# Patient Record
Sex: Male | Born: 1965 | Race: Black or African American | Hispanic: No | Marital: Single | Smoking: Never smoker
Health system: Southern US, Community
[De-identification: ages and names within clinical notes are randomized; demographics above are authoritative.]

---

## 2002-02-04 ENCOUNTER — Encounter: Payer: Self-pay | Admitting: *Deleted

## 2002-02-04 ENCOUNTER — Encounter: Admission: RE | Admit: 2002-02-04 | Discharge: 2002-02-04 | Payer: Self-pay | Admitting: *Deleted

## 2008-06-12 ENCOUNTER — Inpatient Hospital Stay (HOSPITAL_COMMUNITY): Admission: EM | Admit: 2008-06-12 | Discharge: 2008-06-13 | Payer: Self-pay | Admitting: Emergency Medicine
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
## 2013-12-15 ENCOUNTER — Ambulatory Visit: Payer: Self-pay | Admitting: Podiatry

## 2016-08-14 ENCOUNTER — Ambulatory Visit (INDEPENDENT_AMBULATORY_CARE_PROVIDER_SITE_OTHER): Payer: BLUE CROSS/BLUE SHIELD | Admitting: Family Medicine

## 2016-08-14 DIAGNOSIS — M62838 Other muscle spasm: Secondary | ICD-10-CM | POA: Diagnosis not present

## 2016-08-14 MED ORDER — IBUPROFEN 800 MG PO TABS: 800.0000 mg | ORAL_TABLET | Freq: Three times a day (TID) | ORAL | 0 refills | Status: DC | PRN

## 2016-08-14 MED ORDER — CYCLOBENZAPRINE HCL 10 MG PO TABS: 10.0000 mg | ORAL_TABLET | Freq: Three times a day (TID) | ORAL | 0 refills | Status: AC | PRN
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
## 2016-08-14 NOTE — Patient Instructions (Addendum)
   IF you received an x-ray today, you will receive an invoice from [facility]. Please contact [facility] at [phone] with questions or concerns regarding your invoice.   IF you received labwork today, you will receive an invoice from [facility]. Please contact [facility] at [phone] with questions or concerns regarding your invoice.   Our billing staff will not be able to assist you with questions regarding bills from these companies.  You will be contacted with the lab results as soon as they are available. The fastest way to get your results is to activate your My Chart account. Instructions are located on the last page of this paperwork. If you have not heard from us regarding the results in 2 weeks, please contact this office.    Muscle Cramps and Spasms Muscle cramps and spasms occur when a muscle or muscles tighten and you have no control over this tightening (involuntary muscle contraction). They are a common problem and can develop in any muscle. The most common place is in the calf muscles of the leg. Both muscle cramps and muscle spasms are involuntary muscle contractions, but they also have differences:   Muscle cramps are sporadic and painful. They may last a few seconds to a quarter of an hour. Muscle cramps are often more forceful and last longer than muscle spasms.  Muscle spasms may or may not be painful. They may also last just a few seconds or much longer. CAUSES  It is uncommon for cramps or spasms to be due to a serious underlying problem. In many cases, the cause of cramps or spasms is unknown. Some common causes are:   Overexertion.   Overuse from repetitive motions (doing the same thing over and over).   Remaining in a certain position for a long period of time.   Improper preparation, form, or technique while performing a sport or activity.   Dehydration.   Injury.   Side effects of some medicines.    Abnormally low levels of the salts and ions in your blood (electrolytes), especially potassium and calcium. This could happen if you are taking water pills (diuretics) or you are pregnant.  Some underlying medical problems can make it more likely to develop cramps or spasms. These include, but are not limited to:   Diabetes.   Parkinson disease.   Hormone disorders, such as thyroid problems.   Alcohol abuse.   Diseases specific to muscles, joints, and bones.   Blood vessel disease where not enough blood is getting to the muscles.  HOME CARE INSTRUCTIONS   Stay well hydrated. Drink enough water and fluids to keep your urine clear or pale yellow.  It may be helpful to massage, stretch, and relax the affected muscle.  For tight or tense muscles, use a warm towel, heating pad, or hot shower water directed to the affected area.  If you are sore or have pain after a cramp or spasm, applying ice to the affected area may relieve discomfort.  Put ice in a plastic bag.  Place a towel between your skin and the bag.  Leave the ice on for 15-20 minutes, 03-04 times a day.  Medicines used to treat a known cause of cramps or spasms may help reduce their frequency or severity. Only take over-the-counter or prescription medicines as directed by your caregiver. SEEK MEDICAL CARE IF:  Your cramps or spasms get more severe, more frequent, or do not improve over time.  MAKE SURE YOU:     Understand these instructions.  Will watch your condition.  Will get help right away if you are not doing well or get worse.   This information is not intended to replace advice given to you by your health care provider. Make sure you discuss any questions you have with your health care provider.   Document Released: 03/30/2002 Document Revised: 02/02/2013 Document Reviewed: 09/24/2012 Elsevier Interactive Patient Education 2016 Elsevier Inc.  

## 2016-08-17 ENCOUNTER — Ambulatory Visit (INDEPENDENT_AMBULATORY_CARE_PROVIDER_SITE_OTHER): Payer: BLUE CROSS/BLUE SHIELD | Admitting: Podiatry

## 2016-08-17 ENCOUNTER — Encounter: Payer: Self-pay | Admitting: Podiatry

## 2016-08-17 ENCOUNTER — Ambulatory Visit (INDEPENDENT_AMBULATORY_CARE_PROVIDER_SITE_OTHER): Payer: BLUE CROSS/BLUE SHIELD

## 2016-08-17 VITALS — BP 138/84 | HR 71 | Resp 14

## 2016-08-17 DIAGNOSIS — R52 Pain, unspecified: Secondary | ICD-10-CM | POA: Diagnosis not present

## 2016-08-17 DIAGNOSIS — S93601A Unspecified sprain of right foot, initial encounter: Secondary | ICD-10-CM

## 2016-08-17 MED ORDER — HYDROCODONE-ACETAMINOPHEN 5-325 MG PO TABS: 1.0000 | ORAL_TABLET | Freq: Four times a day (QID) | ORAL | 0 refills | Status: AC | PRN
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
## 2016-08-21 ENCOUNTER — Telehealth: Payer: Self-pay | Admitting: *Deleted

## 2016-08-21 DIAGNOSIS — S93601A Unspecified sprain of right foot, initial encounter: Secondary | ICD-10-CM

## 2016-08-21 NOTE — Telephone Encounter (Signed)
Pt states it is hard to get around in the boot. Told pt I would send order to [facility] for the knee scooter, but he should also be resting, icing and elevating the injured foot. Pt states understanding and will wait to hear from [facility].

## 2016-08-28 ENCOUNTER — Ambulatory Visit: Payer: BLUE CROSS/BLUE SHIELD | Admitting: Podiatry

## 2016-08-29 ENCOUNTER — Ambulatory Visit (INDEPENDENT_AMBULATORY_CARE_PROVIDER_SITE_OTHER): Payer: BLUE CROSS/BLUE SHIELD | Admitting: Podiatry

## 2016-08-29 ENCOUNTER — Encounter: Payer: Self-pay | Admitting: Podiatry

## 2016-08-29 VITALS — Ht 74.0 in | Wt 176.0 lb

## 2016-08-29 DIAGNOSIS — S93601D Unspecified sprain of right foot, subsequent encounter: Secondary | ICD-10-CM | POA: Diagnosis not present

## 2016-08-29 DIAGNOSIS — R52 Pain, unspecified: Secondary | ICD-10-CM | POA: Diagnosis not present

## 2016-08-29 DIAGNOSIS — S93601A Unspecified sprain of right foot, initial encounter: Secondary | ICD-10-CM | POA: Diagnosis not present
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
## 2016-09-06 ENCOUNTER — Ambulatory Visit (INDEPENDENT_AMBULATORY_CARE_PROVIDER_SITE_OTHER): Payer: BLUE CROSS/BLUE SHIELD | Admitting: Family Medicine

## 2016-09-06 ENCOUNTER — Ambulatory Visit (INDEPENDENT_AMBULATORY_CARE_PROVIDER_SITE_OTHER): Payer: BLUE CROSS/BLUE SHIELD | Admitting: Podiatry

## 2016-09-06 ENCOUNTER — Ambulatory Visit (INDEPENDENT_AMBULATORY_CARE_PROVIDER_SITE_OTHER): Payer: BLUE CROSS/BLUE SHIELD

## 2016-09-06 ENCOUNTER — Encounter: Payer: Self-pay | Admitting: Podiatry

## 2016-09-06 VITALS — BP 120/80 | HR 83 | Temp 98.3°F | Resp 18 | Ht 74.0 in

## 2016-09-06 VITALS — Ht 74.0 in | Wt 176.0 lb

## 2016-09-06 DIAGNOSIS — M79642 Pain in left hand: Secondary | ICD-10-CM | POA: Diagnosis not present

## 2016-09-06 DIAGNOSIS — S93601D Unspecified sprain of right foot, subsequent encounter: Secondary | ICD-10-CM

## 2016-09-06 MED ORDER — IBUPROFEN 800 MG PO TABS: 800.0000 mg | ORAL_TABLET | Freq: Three times a day (TID) | ORAL | 0 refills | Status: AC | PRN

## 2016-09-06 MED ORDER — IBUPROFEN 800 MG PO TABS: 800.0000 mg | ORAL_TABLET | Freq: Three times a day (TID) | ORAL | 0 refills | Status: DC | PRN
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
## 2016-09-06 NOTE — Patient Instructions (Signed)
     IF you received an x-ray today, you will receive an invoice from [facility]. Please contact [facility] at [phone] with questions or concerns regarding your invoice.   IF you received labwork today, you will receive an invoice from [facility]. Please contact [facility] at [phone] with questions or concerns regarding your invoice.   Our billing staff will not be able to assist you with questions regarding bills from these companies.  You will be contacted with the lab results as soon as they are available. The fastest way to get your results is to activate your My Chart account. Instructions are located on the last page of this paperwork. If you have not heard from us regarding the results in 2 weeks, please contact this office.      

## 2016-09-20 ENCOUNTER — Ambulatory Visit (INDEPENDENT_AMBULATORY_CARE_PROVIDER_SITE_OTHER): Payer: BLUE CROSS/BLUE SHIELD | Admitting: Orthopaedic Surgery

## 2016-09-20 ENCOUNTER — Encounter (INDEPENDENT_AMBULATORY_CARE_PROVIDER_SITE_OTHER): Payer: Self-pay | Admitting: Orthopaedic Surgery

## 2016-09-20 DIAGNOSIS — M79642 Pain in left hand: Secondary | ICD-10-CM

## 2016-09-20 MED ORDER — DICLOFENAC SODIUM 1 % TD GEL: 2.0000 g | Freq: Four times a day (QID) | TRANSDERMAL | 5 refills | Status: DC

## 2016-09-20 MED ORDER — DICLOFENAC SODIUM 1 % TD GEL: 2.0000 g | Freq: Four times a day (QID) | TRANSDERMAL | 5 refills | Status: AC
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
## 2016-09-28 ENCOUNTER — Ambulatory Visit (INDEPENDENT_AMBULATORY_CARE_PROVIDER_SITE_OTHER): Payer: BLUE CROSS/BLUE SHIELD | Admitting: Podiatry

## 2016-09-28 ENCOUNTER — Ambulatory Visit (INDEPENDENT_AMBULATORY_CARE_PROVIDER_SITE_OTHER): Payer: BLUE CROSS/BLUE SHIELD

## 2016-09-28 ENCOUNTER — Encounter: Payer: Self-pay | Admitting: Podiatry

## 2016-09-28 DIAGNOSIS — R52 Pain, unspecified: Secondary | ICD-10-CM

## 2016-09-28 DIAGNOSIS — M79671 Pain in right foot: Secondary | ICD-10-CM | POA: Diagnosis not present

## 2016-09-28 DIAGNOSIS — S93601D Unspecified sprain of right foot, subsequent encounter: Secondary | ICD-10-CM | POA: Diagnosis not present
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
## 2017-02-14 ENCOUNTER — Telehealth (INDEPENDENT_AMBULATORY_CARE_PROVIDER_SITE_OTHER): Payer: Self-pay | Admitting: Orthopaedic Surgery

## 2017-02-14 NOTE — Telephone Encounter (Signed)
FAXED 09/20/2016 PROGRESS NOTE TO "[facility]" PER PATIENTS SIGNED AUTHORIZATION.

## 2019-07-08 ENCOUNTER — Other Ambulatory Visit: Payer: Self-pay

## 2019-07-08 ENCOUNTER — Ambulatory Visit: Payer: BLUE CROSS/BLUE SHIELD | Admitting: Podiatry

## 2019-07-08 ENCOUNTER — Ambulatory Visit: Payer: BC Managed Care – PPO

## 2019-07-08 DIAGNOSIS — S90211A Contusion of right great toe with damage to nail, initial encounter: Secondary | ICD-10-CM | POA: Diagnosis not present

## 2019-07-08 DIAGNOSIS — S90221A Contusion of right lesser toe(s) with damage to nail, initial encounter: Secondary | ICD-10-CM

## 2019-07-08 MED ORDER — GENTAMICIN SULFATE 0.1 % EX CREA: 1.0000 "application " | TOPICAL_CREAM | Freq: Two times a day (BID) | CUTANEOUS | 1 refills | Status: DC

## 2019-07-08 MED ORDER — MELOXICAM 15 MG PO TABS: 15.0000 mg | ORAL_TABLET | Freq: Every day | ORAL | 3 refills | Status: AC

## 2019-07-08 MED ORDER — MELOXICAM 15 MG PO TABS
15.0000 mg | ORAL_TABLET | Freq: Every day | ORAL | 3 refills | Status: DC
Start: 2019-07-08 — End: 2019-07-08

## 2019-07-08 MED ORDER — GENTAMICIN SULFATE 0.1 % EX CREA: 1.0000 "application " | TOPICAL_CREAM | Freq: Two times a day (BID) | CUTANEOUS | 1 refills | Status: AC

## 2019-07-08 NOTE — Patient Instructions (Signed)
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
## 2019-10-21 ENCOUNTER — Ambulatory Visit: Payer: BC Managed Care – PPO | Admitting: Podiatry

## 2019-11-02 ENCOUNTER — Other Ambulatory Visit: Payer: Self-pay

## 2019-11-02 ENCOUNTER — Ambulatory Visit: Payer: BC Managed Care – PPO | Admitting: Podiatry

## 2019-11-02 DIAGNOSIS — L603 Nail dystrophy: Secondary | ICD-10-CM

## 2019-11-02 DIAGNOSIS — S90221A Contusion of right lesser toe(s) with damage to nail, initial encounter: Secondary | ICD-10-CM

## 2019-11-02 DIAGNOSIS — L989 Disorder of the skin and subcutaneous tissue, unspecified: Secondary | ICD-10-CM | POA: Diagnosis not present

## 2019-11-02 NOTE — Patient Instructions (Signed)
Corn and callus remover (active ingredient is Salicylic acid)

## 2019-11-05 NOTE — Progress Notes (Signed)
   Subjective: 54 y.o. male presenting today for follow up evaluation of right great toenail trauma. He states the nail is now growing back thickened and discolored. He denies any pain and has not done anything for treatment.  He also reports painful callus lesions to the plantar aspects of the bilateral feet that have been present for the past several months. He has been taking Meloxicam for treatment and states walking and bearing weight increases the pain. Patient is here for further evaluation and treatment.   No past medical history on file.  Objective: Physical Exam General: The patient is alert and oriented x3 in no acute distress.  Dermatology: Hyperkeratotic, discolored, thickened, onychodystrophy noted to the right great toenail. Skin is warm, dry and supple bilateral lower extremities. Negative for open lesions or macerations. Hyperkeratotic lesion(s) present on the bilateral feet. Pain on palpation with a central nucleated core noted.   Vascular: Palpable pedal pulses bilaterally. No edema or erythema noted. Capillary refill within normal limits.  Neurological: Epicritic and protective threshold grossly intact bilaterally.   Musculoskeletal Exam: Range of motion within normal limits to all pedal and ankle joints bilateral. Muscle strength 5/5 in all groups bilateral.   Assessment: #1 Onychomycosis right great toenail  #2 Hyperkeratotic nails right great toenail  #3 Porokeratosis bilateral feet   Plan of Care:  #1 Patient was evaluated. #2 Mechanical debridement of the right great toenail performed using a nail nipper. Filed with dremel without incident.  #3 Excisional debridement of keratotic lesion(s) using a chisel blade was performed without incident. Light dressing applied.  #4 Recommended OTC corn and callus remover.  #5 Continue taking Meloxicam as needed.  #6 Return to clinic as needed.    Felecia Shelling, DPM [facility]  Dr. Felecia Shelling, DPM      [address]                                        [place], [place] [address]                Office [phone]  Fax [phone]

## 2020-05-08 ENCOUNTER — Emergency Department (HOSPITAL_COMMUNITY): Payer: BC Managed Care – PPO

## 2020-05-08 ENCOUNTER — Emergency Department (HOSPITAL_COMMUNITY)
Admission: EM | Admit: 2020-05-08 | Discharge: 2020-05-08 | Disposition: A | Discharge status: Home / Self Care | Payer: BC Managed Care – PPO | Attending: Emergency Medicine | Admitting: Emergency Medicine

## 2020-05-08 DIAGNOSIS — R42 Dizziness and giddiness: Secondary | ICD-10-CM | POA: Diagnosis not present

## 2020-05-08 LAB — COMPREHENSIVE METABOLIC PANEL
ALT: 18 U/L (ref 0–44)
AST: 17 U/L (ref 15–41)
Albumin: 3.9 g/dL (ref 3.5–5.0)
Alkaline Phosphatase: 64 U/L (ref 38–126)
Anion gap: 8 (ref 5–15)
BUN: 15 mg/dL (ref 6–20)
CO2: 24 mmol/L (ref 22–32)
Calcium: 8.5 mg/dL — ABNORMAL LOW (ref 8.9–10.3)
Chloride: 106 mmol/L (ref 98–111)
Creatinine, Ser: 1.18 mg/dL (ref 0.61–1.24)
GFR calc Af Amer: >60 mL/min (ref >60)
GFR calc non Af Amer: >60 mL/min (ref >60)
Glucose, Bld: 132 mg/dL — ABNORMAL HIGH (ref 70–99)
Potassium: 4.4 mmol/L (ref 3.5–5.1)
Sodium: 138 mmol/L (ref 135–145)
Total Bilirubin: 0.6 mg/dL (ref 0.3–1.2)
Total Protein: 7.6 g/dL (ref 6.5–8.1)

## 2020-05-08 LAB — CBC WITH DIFFERENTIAL/PLATELET
Abs Immature Granulocytes: 0.01 10*3/uL (ref 0.00–0.07)
Basophils Absolute: 0 10*3/uL (ref 0.0–0.1)
Basophils Relative: 0 %
Eosinophils Absolute: 0 10*3/uL (ref 0.0–0.5)
Eosinophils Relative: 0 %
HCT: 38.8 % — ABNORMAL LOW (ref 39.0–52.0)
Hemoglobin: 12.2 g/dL — ABNORMAL LOW (ref 13.0–17.0)
Immature Granulocytes: 0 %
Lymphocytes Relative: 15 %
Lymphs Abs: 0.9 10*3/uL (ref 0.7–4.0)
MCH: 27.5 pg (ref 26.0–34.0)
MCHC: 31.4 g/dL (ref 30.0–36.0)
MCV: 87.4 fL (ref 80.0–100.0)
Monocytes Absolute: 0.3 10*3/uL (ref 0.1–1.0)
Monocytes Relative: 5 %
Neutro Abs: 5.1 10*3/uL (ref 1.7–7.7)
Neutrophils Relative %: 80 %
Platelets: 266 10*3/uL (ref 150–400)
RBC: 4.44 MIL/uL (ref 4.22–5.81)
RDW: 13.3 % (ref 11.5–15.5)
WBC: 6.3 10*3/uL (ref 4.0–10.5)
nRBC: 0 % (ref 0.0–0.2)

## 2020-05-08 MED ORDER — SODIUM CHLORIDE 0.9 % IV BOLUS: 1000.0000 mL | Freq: Once | INTRAVENOUS | Status: DC

## 2020-05-08 NOTE — ED Triage Notes (Signed)
Pt here from home with c/o diziness dry mouth  And tachycardia

## 2020-05-08 NOTE — ED Notes (Signed)
Pt ambulated in hall, Denies dizziness or unsteadiness.  Gait appears normal.
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
## 2020-05-08 NOTE — Discharge Instructions (Addendum)
While you do not have any room spinning dizziness, would like for you to read the attachment on dizziness as it offers good advice.  Your formal diagnosis is disequilibrium, unknown cause.  I suspect that you may have been dehydrated.  Laboratory work-up is entirely unremarkable.  CT imaging obtained of the head was negative.  I am glad that you have improved over the course of your stay here in the ED and that you feel prepared for discharge.    Return to the ED or seek immediate medical attention should you experience any new or worsening symptoms.

## 2020-10-10 ENCOUNTER — Other Ambulatory Visit: Payer: Self-pay

## 2020-10-10 ENCOUNTER — Ambulatory Visit (INDEPENDENT_AMBULATORY_CARE_PROVIDER_SITE_OTHER): Payer: PRIVATE HEALTH INSURANCE | Admitting: Podiatry

## 2020-10-10 DIAGNOSIS — L989 Disorder of the skin and subcutaneous tissue, unspecified: Secondary | ICD-10-CM | POA: Diagnosis not present

## 2020-10-10 DIAGNOSIS — L603 Nail dystrophy: Secondary | ICD-10-CM

## 2020-10-10 NOTE — Progress Notes (Signed)
   Subjective: 54 y.o. male presenting to the office today for evaluation of symptomatic calluses to the bilateral feet.  Patient was last seen in the office just under a year ago and the callus lesions were debrided.  Patient noticed significant relief at that time.  He states that over time the calluses have returned.  He presents for follow-up treatment evaluation   No past medical history on file.   Objective:  Physical Exam General: Alert and oriented x3 in no acute distress  Dermatology: Hyperkeratotic lesion(s) present on the bilateral feet. Pain on palpation with a central nucleated core noted. Skin is warm, dry and supple bilateral lower extremities. Negative for open lesions or macerations.  Vascular: Palpable pedal pulses bilaterally. No edema or erythema noted. Capillary refill within normal limits.  Neurological: Epicritic and protective threshold grossly intact bilaterally.   Musculoskeletal Exam: Pain on palpation at the keratotic lesion(s) noted. Range of motion within normal limits bilateral. Muscle strength 5/5 in all groups bilateral.  Assessment: 1.  Preulcerative calluses/porokeratosis bilateral feet   Plan of Care:  1. Patient evaluated 2. Excisional debridement of keratoic lesion(s) using a chisel blade was performed without incident.  3. Dressed area with light dressing. 4.  Recommend OTC corn and callus remover  5.  Patient is to return to the clinic PRN.   Felecia Shelling, DPM [facility]  Dr. Felecia Shelling, DPM    [address] [place], [place] [address]                Office [phone]  Fax [phone]

## 2021-01-02 IMAGING — CT CT HEAD W/O CM
3 of 4 series · 13 of 47 positions shown, 15 images · non-contrast
Comparison: None.

CLINICAL DATA: Ataxia, dizziness.

EXAM:
CT HEAD WITHOUT CONTRAST
TECHNIQUE: Contiguous axial images were obtained from the base of the skull
through the vertex without intravenous contrast.

[Series 3: head without · axial · non-contrast · 0.49mm/px · z∈[-98,+22]mm · 7 of 34 slices shown, 9 images]
[im 5/34  brain]
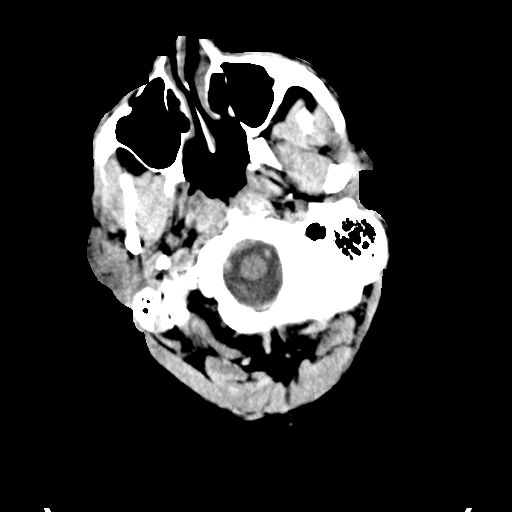
[im 5/34  bone]
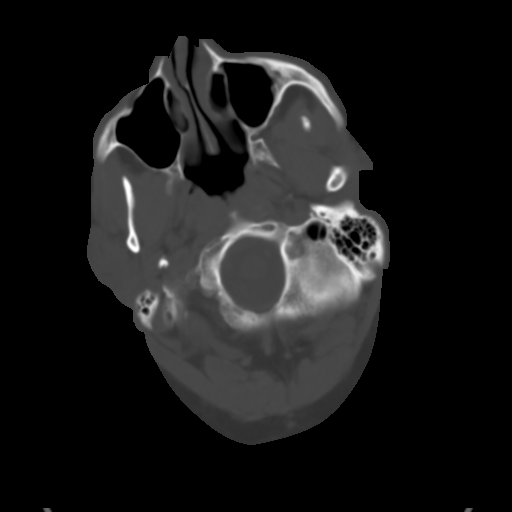
[im 9/34  brain]
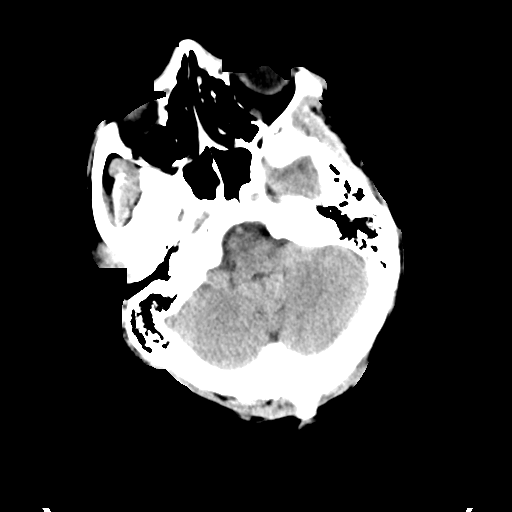
[im 13/34  brain]
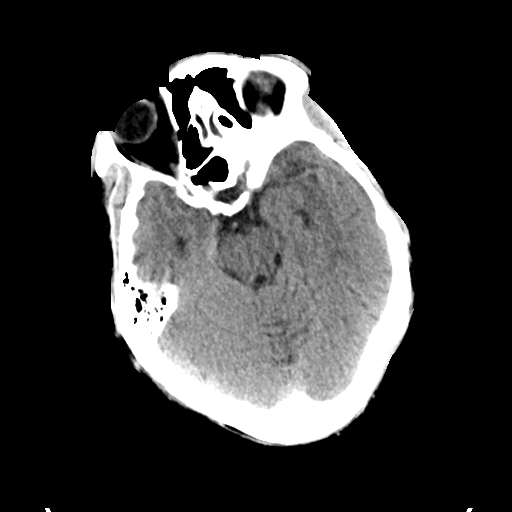
[im 17/34  brain]
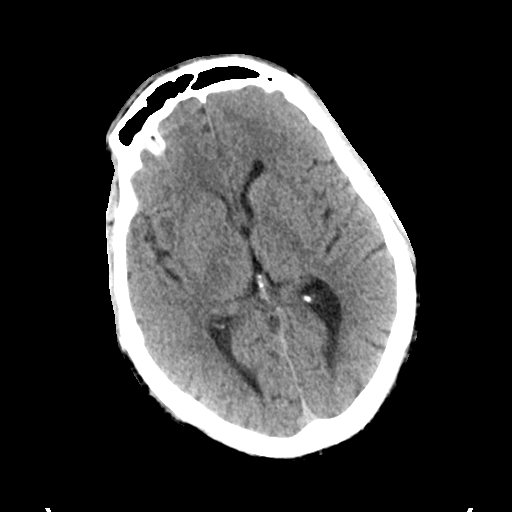
[im 21/34  brain]
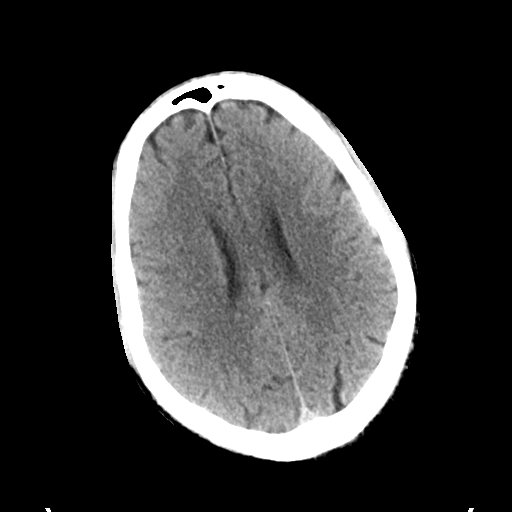
[im 21/34  bone]
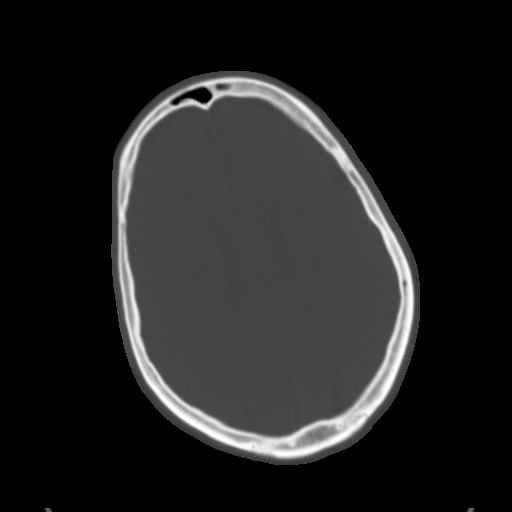
[im 25/34  brain]
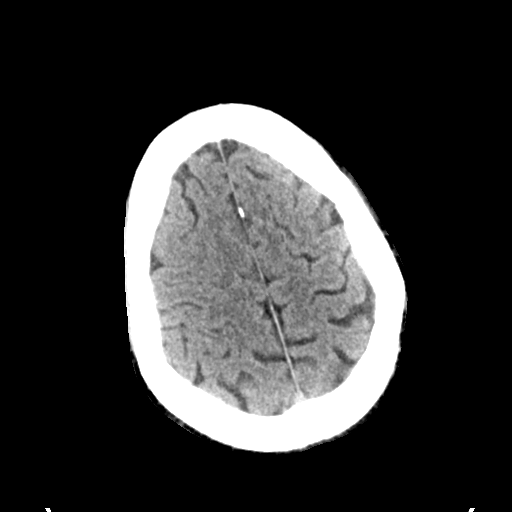
[im 29/34  brain]
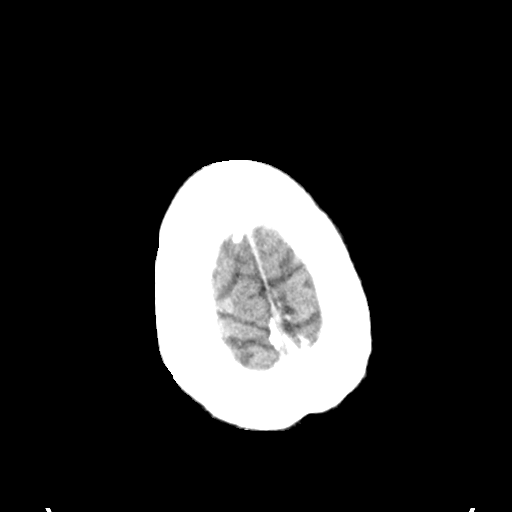

[Series 5: head without cor · coronal · non-contrast · 0.33mm/px · 3 of 77 slices shown]
[im 26/77  brain]
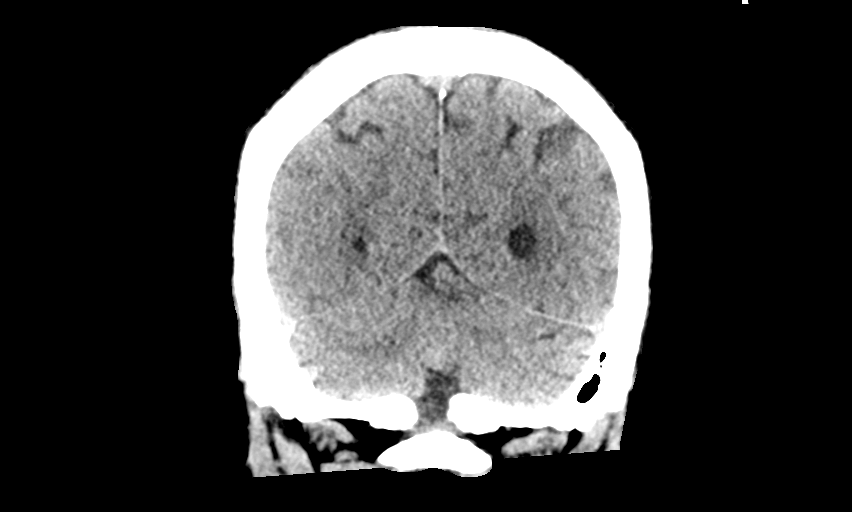
[im 34/77  brain]
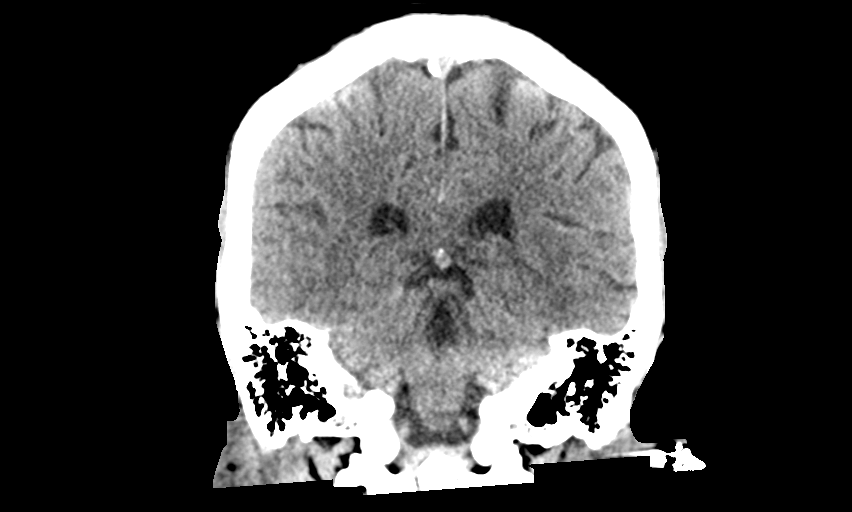
[im 43/77  brain]
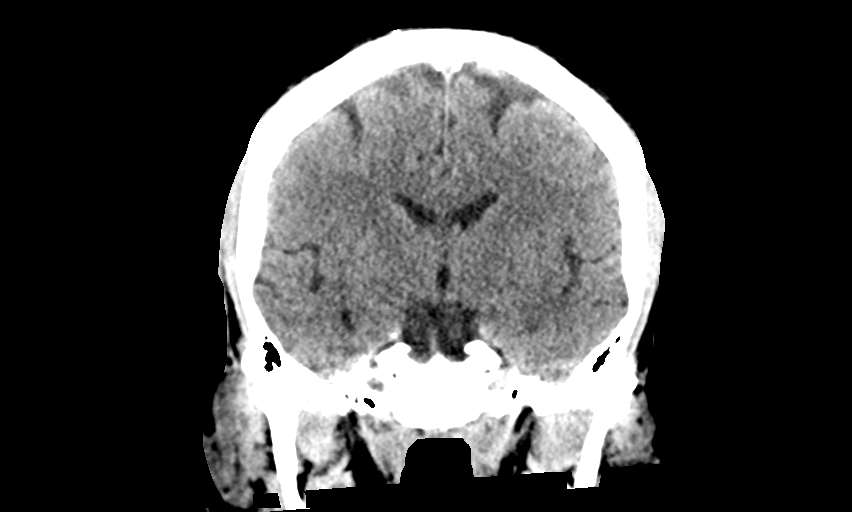

[Series 6: head without sag · sagittal · non-contrast · 0.33mm/px · 3 of 59 slices shown]
[im 20/59  brain]
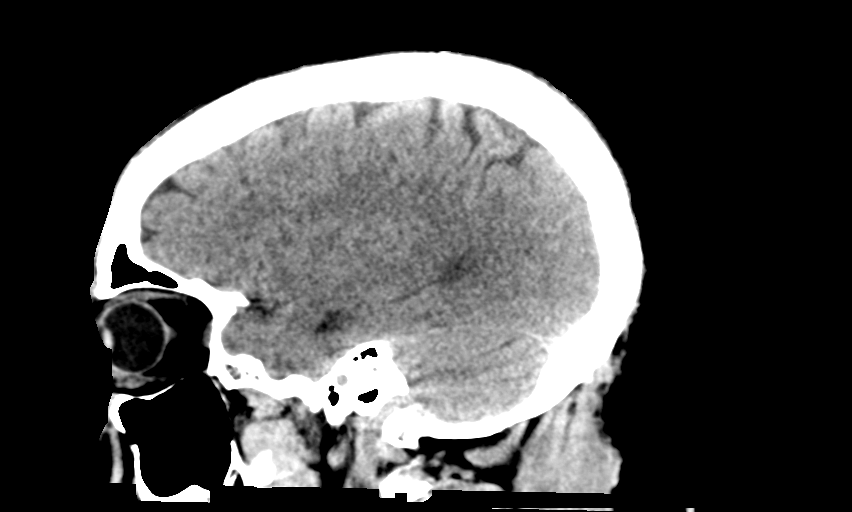
[im 30/59  brain]
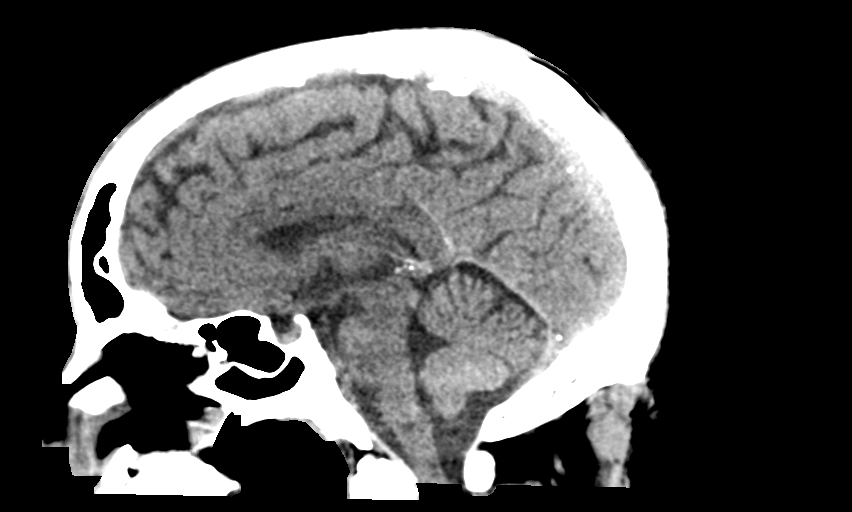
[im 39/59  brain]
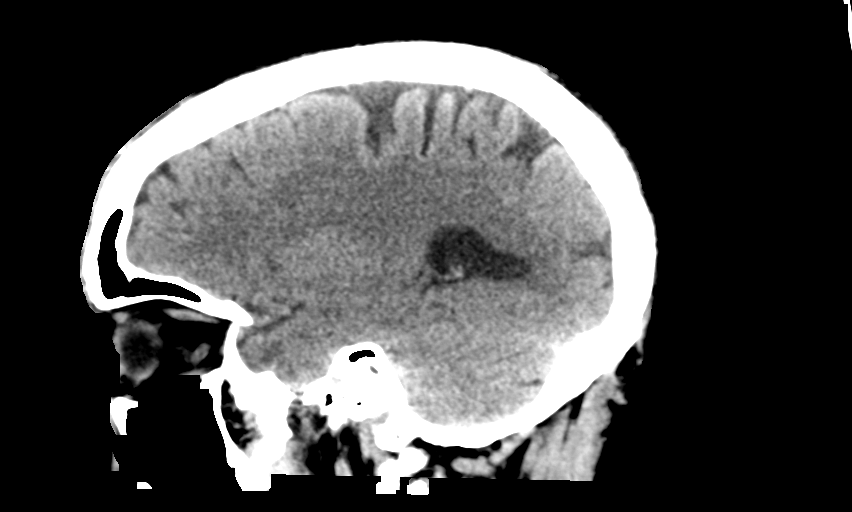

[13 of 47 positions shown; findings below may reference images not displayed]

FINDINGS: Brain: No evidence of acute infarction, hemorrhage, hydrocephalus,
extra-axial collection or mass lesion/mass effect.

Vascular: No hyperdense vessel or unexpected calcification.

Skull: Normal. Negative for fracture or focal lesion.

Sinuses/Orbits: No acute finding.

Other: None.
IMPRESSION: Normal head CT.

## 2021-07-24 ENCOUNTER — Ambulatory Visit (INDEPENDENT_AMBULATORY_CARE_PROVIDER_SITE_OTHER): Payer: PRIVATE HEALTH INSURANCE | Admitting: Podiatry

## 2021-07-24 ENCOUNTER — Other Ambulatory Visit: Payer: Self-pay

## 2021-07-24 DIAGNOSIS — B351 Tinea unguium: Secondary | ICD-10-CM

## 2021-07-24 DIAGNOSIS — M79674 Pain in right toe(s): Secondary | ICD-10-CM

## 2021-07-24 MED ORDER — TERBINAFINE HCL 250 MG PO TABS: 250.0000 mg | ORAL_TABLET | Freq: Every day | ORAL | 0 refills | Status: AC

## 2021-07-24 NOTE — Progress Notes (Signed)
   Subjective: 55 y.o. male presenting today with a new complaint regarding discoloration and thickening to the right hallux nail plate.  Patient does have a history of total temporary nail avulsion to the right hallux nail plate.  He says that the nail plate is now thick and discolored with white substance over the tip of the toe.  He presents for further treatment and evaluation  No past medical history on file.  Objective: Physical Exam General: The patient is alert and oriented x3 in no acute distress.  Dermatology: Hyperkeratotic, discolored, thickened, onychodystrophy noted to the right hallux nail plate. Skin is warm, dry and supple bilateral lower extremities. Negative for open lesions or macerations.  Vascular: Palpable pedal pulses bilaterally. No edema or erythema noted. Capillary refill within normal limits.  Neurological: Epicritic and protective threshold grossly intact bilaterally.   Musculoskeletal Exam: Range of motion within normal limits to all pedal and ankle joints bilateral. Muscle strength 5/5 in all groups bilateral.   Assessment: #1 Onychomycosis of toenails right hallux nail plate  Plan of Care:  #1 Patient was evaluated. #2  Today we discussed different treatment options including oral, topical, and laser antifungal treatment modalities.  We discussed their efficacies and side effects.  Patient opts for oral antifungal treatment modality #3 prescription for Lamisil 250 mg #90 daily.  Patient denies a history of liver pathology or symptoms.  Patient is otherwise healthy #4 return to clinic 6 months   Felecia Shelling, DPM [facility]  Dr. Felecia Shelling, DPM    [address] [place], [place] [address]                Office [phone]  Fax [phone]

## 2021-09-25 ENCOUNTER — Other Ambulatory Visit: Payer: Self-pay | Admitting: Podiatry

## 2021-12-18 ENCOUNTER — Other Ambulatory Visit: Payer: Self-pay

## 2021-12-18 ENCOUNTER — Ambulatory Visit (INDEPENDENT_AMBULATORY_CARE_PROVIDER_SITE_OTHER): Payer: BC Managed Care – PPO | Admitting: Podiatry

## 2021-12-18 DIAGNOSIS — M79674 Pain in right toe(s): Secondary | ICD-10-CM

## 2021-12-18 DIAGNOSIS — B351 Tinea unguium: Secondary | ICD-10-CM

## 2021-12-18 NOTE — Progress Notes (Signed)
° °  HPI: 56 y.o. male presenting today for follow-up evaluation of onychomycosis to the right hallux nail plate.  Patient completed the full 90 days of the oral Lamisil 250 mg daily as prescribed.  He has not noticed much improvement.  He presents for further treatment and evaluation  No past medical history on file.  No past surgical history on file.  No Known Allergies   Physical Exam: General: The patient is alert and oriented x3 in no acute distress.  Dermatology: Skin is warm, dry and supple bilateral lower extremities. Negative for open lesions or macerations.  There continues to be a hyperkeratotic thick dystrophic nail noted to the right hallux nail plate however the base of the nail actually appears to be somewhat healthy and viable.  Vascular: Palpable pedal pulses bilaterally. Capillary refill within normal limits.  Negative for any significant edema or erythema  Neurological: Light touch and protective threshold grossly intact  Musculoskeletal Exam: No pedal deformities noted  Assessment: 1.  Onychomycosis of toenail right hallux nail plate   Plan of Care:  1. Patient evaluated.  2.  Explained to the patient that I do see some demonstration of healthy nail growing from the base of the nail plate.  We will simply observe for now 3.  Topical formula 3 antifungal nail lacquer was dispensed at checkout to apply daily 4.  Return to clinic as needed      Felecia Shelling, DPM [facility]  Dr. Felecia Shelling, DPM    [address] [place], [place] [address]                Office [phone]  Fax [phone]

## 2022-07-12 ENCOUNTER — Ambulatory Visit: Payer: BC Managed Care – PPO | Admitting: Podiatry
